# Patient Record
Sex: Male | Born: 1975 | Race: White | Hispanic: No | Marital: Married | Smoking: Never smoker
Health system: Southern US, Community
[De-identification: ages and names within clinical notes are randomized; demographics above are authoritative.]

## PROBLEM LIST (undated history)

## (undated) DIAGNOSIS — Z8619 Personal history of other infectious and parasitic diseases: Secondary | ICD-10-CM

## (undated) HISTORY — DX: Personal history of other infectious and parasitic diseases: Z86.19

---

## 2014-01-15 HISTORY — PX: SHOULDER SURGERY: SHX246

## 2014-07-28 ENCOUNTER — Telehealth: Payer: Self-pay | Admitting: Family Medicine

## 2014-07-28 ENCOUNTER — Ambulatory Visit (INDEPENDENT_AMBULATORY_CARE_PROVIDER_SITE_OTHER): Payer: 59 | Admitting: Family Medicine

## 2014-07-28 ENCOUNTER — Encounter: Payer: Self-pay | Admitting: Family Medicine

## 2014-07-28 VITALS — BP 117/79 | HR 73 | Temp 98.2°F | Resp 16 | Ht 69.0 in | Wt 214.4 lb

## 2014-07-28 DIAGNOSIS — H9191 Unspecified hearing loss, right ear: Secondary | ICD-10-CM

## 2014-07-28 NOTE — Progress Notes (Signed)
Name: Jimmy Roach   MRN: [id number]    DOB: 01/04/1976   Date:07/28/2014       Progress Note  Subjective  Chief Complaint  Chief Complaint  Patient presents with  . Ear Pain    thinks its swimmers ear. (Right)  x 1 week     HPI  R ear pain x 1 week.  Decreased hearing.  Pain gotten some better, but hearing worse.  Past Medical History  Diagnosis Date  . History of chicken pox     History  Substance Use Topics  . Smoking status: Never Smoker   . Smokeless tobacco: Never Used  . Alcohol Use: No    No current outpatient prescriptions on file.  No Known Allergies  Review of Systems  Constitutional: Negative.  Negative for fever and chills.  HENT: Positive for ear pain, hearing loss and tinnitus. Negative for congestion and ear discharge.   Eyes: Negative for blurred vision and double vision.  Respiratory: Negative for cough, shortness of breath and wheezing.   Cardiovascular: Negative for chest pain and leg swelling.  Gastrointestinal: Negative for abdominal pain and blood in stool.  Genitourinary: Negative for urgency and frequency.  Musculoskeletal: Joint pain: s/p L. shoulder surgery   Skin: Negative for rash.  Neurological: Negative for headaches.      Objective  Filed Vitals:   07/28/14 0838  BP: 117/79  Pulse: 73  Temp: 98.2 F (36.8 C)  Resp: 16  Height: 5\' 9"  (1.753 m)  Weight: 214 lb 6.4 oz (97.251 kg)     Physical Exam  Constitutional: He is well-developed, well-nourished, and in no distress.  HENT:  Head: Normocephalic and atraumatic.  Right Ear: External ear and ear canal normal. Tympanic membrane is perforated (?) and retracted. Decreased hearing is noted.  Left Ear: Hearing, tympanic membrane, external ear and ear canal normal.  Ears:  Neck: Normal range of motion. Neck supple. No thyromegaly present.  Cardiovascular: Normal rate, regular rhythm, normal heart sounds and intact distal pulses.  Exam reveals no gallop and no friction rub.    No murmur heard. Pulmonary/Chest: Effort normal and breath sounds normal. No respiratory distress. He has no wheezes. He has no rales.  Lymphadenopathy:    He has no cervical adenopathy.  Vitals reviewed.       Assessment & Plan  1. Decreased hearing, right  - Ambulatory referral to ENT.

## 2014-07-28 NOTE — Telephone Encounter (Signed)
Pt has appointment on 08/02/2014 at 2:45 pm pt aware

## 2014-07-28 NOTE — Patient Instructions (Signed)
Keep water and fluids out of ear at this time.

## 2014-11-04 ENCOUNTER — Ambulatory Visit (INDEPENDENT_AMBULATORY_CARE_PROVIDER_SITE_OTHER): Payer: PRIVATE HEALTH INSURANCE | Admitting: Family Medicine

## 2014-11-04 ENCOUNTER — Encounter: Payer: Self-pay | Admitting: Family Medicine

## 2014-11-04 VITALS — BP 144/93 | HR 96 | Temp 99.3°F | Resp 16 | Ht 69.0 in | Wt 218.0 lb

## 2014-11-04 DIAGNOSIS — J069 Acute upper respiratory infection, unspecified: Secondary | ICD-10-CM

## 2014-11-04 NOTE — Progress Notes (Signed)
Date:  11/04/2014   Name:  Jimmy Roach   DOB:  06/24/1975   MRN:  [id number]  PCP:  Fidel LevyJames Hawkins Jr, MD    Chief Complaint: Sinusitis   History of Present Illness:  This is a 39 y.o. male with 3d hx sinus pressure, sore throat, B otalgia R>L, clear rhinorrhea, low grade fever. Sxs not worsening, nonsmoker, wife and son with similar sxs, has taken no OTC meds.  Review of Systems:  Review of Systems  HENT: Negative for ear discharge and facial swelling.   Respiratory: Negative for cough and shortness of breath.   Hematological: Negative for adenopathy.    There are no active problems to display for this patient.   Prior to Admission medications   Not on File    No Known Allergies  Past Surgical History  Procedure Laterality Date  . Shoulder surgery Left 2016    tendon     Social History  Substance Use Topics  . Smoking status: Never Smoker   . Smokeless tobacco: Never Used  . Alcohol Use: No    No family history on file.  Medication list has been reviewed and updated.  Physical Examination: BP 144/93 mmHg  Pulse 96  Temp(Src) 99.3 F (37.4 C) (Oral)  Resp 16  Ht 5\' 9"  (1.753 m)  Wt 218 lb (98.884 kg)  BMI 32.18 kg/m2  Physical Exam  Constitutional: He appears well-developed and well-nourished.  HENT:  Right Ear: External ear normal.  Left Ear: External ear normal.  Mouth/Throat: No oropharyngeal exudate.  B TM's mildly injected OP mild erythema No sinus tenderness  Pulmonary/Chest: Effort normal and breath sounds normal. He has no wheezes. He has no rales.  Lymphadenopathy:    He has no cervical adenopathy.  Neurological: He is alert.  Skin: Skin is warm and dry.  Psychiatric: He has a normal mood and affect. His behavior is normal.  Nursing note and vitals reviewed.   Assessment and Plan:  1. Viral URI Recommend Sudafed 12h or Afrin NS bid x 3d only, call if sxs worsen or persist   Return if symptoms worsen or fail to  improve.  Dionne AnoWilliam M. Kingsley SpittlePlonk, Jr. MD [facility]  11/04/2014

## 2018-12-24 ENCOUNTER — Other Ambulatory Visit: Payer: Self-pay

## 2018-12-24 ENCOUNTER — Ambulatory Visit
Admission: EM | Admit: 2018-12-24 | Discharge: 2018-12-24 | Disposition: A | Discharge status: Home / Self Care | Payer: No Typology Code available for payment source | Attending: Family Medicine | Admitting: Family Medicine

## 2018-12-24 ENCOUNTER — Encounter: Payer: Self-pay | Admitting: Emergency Medicine

## 2018-12-24 DIAGNOSIS — K5900 Constipation, unspecified: Secondary | ICD-10-CM | POA: Diagnosis not present

## 2018-12-24 MED ORDER — MAGNESIUM CITRATE PO SOLN: 1.0000 | Freq: Once | ORAL | 0 refills | Status: AC

## 2018-12-24 MED ORDER — POLYETHYLENE GLYCOL 3350 17 GM/SCOOP PO POWD: ORAL | 0 refills | Status: DC

## 2018-12-24 NOTE — ED Triage Notes (Addendum)
Patient in today c/o constipation x 2-3 days. Patient's last BM was 4 days ago. Patient took OTC stool softener last night. Patient has not tried any laxatives. Patient states the constipation is so bad that he is having trouble urinating as well.

## 2018-12-24 NOTE — Discharge Instructions (Signed)
Lots of fluids.  Medication as directed.  If no relief, I would try a fleets enema. If persists after that, would recommend manual disimpaction (preferably in the ER)

## 2018-12-24 NOTE — ED Provider Notes (Signed)
[facility]    CSN: [id number] Arrival date & time: 12/24/18  0846  History   Chief Complaint Chief Complaint  Patient presents with  . Constipation   HPI  43 year old male presents with constipation.  Patient reports that he has not had a bowel movement in 3 days.  Patient reports discomfort in his lower abdomen.  Patient states that he has recently tried to have a bowel movement and was unsuccessful.  He states that he noted some stool with wiping.  He states that it was very painful.  Patient has tried a stool softener without resolution.  No other medications or interventions tried.  Denies nausea or vomiting.  He is still able to eat and drink.  No known inciting factor other than stress.  No changes in diet.  No known exacerbating factors.   PMH, Surgical Hx, Family Hx, Social History reviewed and updated as below.  Past Medical History:  Diagnosis Date  . History of chicken pox    Past Surgical History:  Procedure Laterality Date  . SHOULDER SURGERY Left 2016   tendon    Home Medications    Prior to Admission medications   Medication Sig Start Date End Date Taking? Authorizing Provider  magnesium citrate SOLN Take 296 mLs (1 Bottle total) by mouth once for 1 dose. 12/24/18 12/24/18  Tommie Sams, DO  polyethylene glycol powder (GLYCOLAX/MIRALAX) 17 GM/SCOOP powder 17 g once or twice daily as needed for constipation. 12/24/18   Tommie Sams, DO    Family History Family History  Problem Relation Age of Onset  . Hypertension Mother   . Heart attack Father        late 28s early 67s  . Hyperlipidemia Father     Social History Social History   Tobacco Use  . Smoking status: Never Smoker  . Smokeless tobacco: Never Used  Substance Use Topics  . Alcohol use: No  . Drug use: No    Allergies   Patient has no known allergies.  Review of Systems Review of Systems  Constitutional: Negative.   Gastrointestinal: Positive for constipation.   Physical  Exam Triage Vital Signs ED Triage Vitals  Enc Vitals Group     BP 12/24/18 0915 136/82     Pulse Rate 12/24/18 0915 66     Resp 12/24/18 0915 18     Temp 12/24/18 0915 98.4 F (36.9 C)     Temp Source 12/24/18 0915 Oral     SpO2 12/24/18 0915 99 %     Weight 12/24/18 0916 207 lb (93.9 kg)     Height 12/24/18 0916 5\' 9"  (1.753 m)     Head Circumference --      Peak Flow --      Pain Score 12/24/18 0916 0     Pain Loc --      Pain Edu? --      Excl. in GC? --    Updated Vital Signs BP 136/82 (BP Location: Left Arm)   Pulse 66   Temp 98.4 F (36.9 C) (Oral)   Resp 18   Ht 5\' 9"  (1.753 m)   Wt 93.9 kg   SpO2 99%   BMI 30.57 kg/m   Visual Acuity Right Eye Distance:   Left Eye Distance:   Bilateral Distance:    Right Eye Near:   Left Eye Near:    Bilateral Near:     Physical Exam Vitals signs and nursing note reviewed.  Constitutional:  General: He is not in acute distress.    Appearance: Normal appearance. He is not ill-appearing.  HENT:     Head: Normocephalic and atraumatic.  Eyes:     General:        Right eye: No discharge.        Left eye: No discharge.     Conjunctiva/sclera: Conjunctivae normal.  Cardiovascular:     Rate and Rhythm: Normal rate and regular rhythm.     Heart sounds: No murmur.  Pulmonary:     Effort: Pulmonary effort is normal.     Breath sounds: Normal breath sounds. No wheezing, rhonchi or rales.  Abdominal:     General: There is no distension.     Palpations: Abdomen is soft.     Comments: No discrete areas of tenderness.  Slightly decreased bowel sounds.  However, bowel sounds present in all 4 quadrants.  Neurological:     Mental Status: He is alert.  Psychiatric:        Mood and Affect: Mood normal.        Behavior: Behavior normal.    UC Treatments / Results  Labs (all labs ordered are listed, but only abnormal results are displayed) Labs Reviewed - No data to display  EKG   Radiology No results found.   Procedures Procedures (including critical care time)  Medications Ordered in UC Medications - No data to display  Initial Impression / Assessment and Plan / UC Course  I have reviewed the triage vital signs and the nursing notes.  Pertinent labs & imaging results that were available during my care of the patient were reviewed by me and considered in my medical decision making (see chart for details).    43 year old male presents with constipation.  Treating with mag citrate and subsequently MiraLAX.  Advise use of an enema if needed.  If subsequently persists, should go to the ER for disimpaction.  Final Clinical Impressions(s) / UC Diagnoses   Final diagnoses:  Constipation, unspecified constipation type     Discharge Instructions     Lots of fluids.  Medication as directed.  If no relief, I would try a fleets enema. If persists after that, would recommend manual disimpaction (preferably in the ER)   ED Prescriptions    Medication Sig Dispense Auth. Provider   magnesium citrate SOLN Take 296 mLs (1 Bottle total) by mouth once for 1 dose. 296 mL Griffen Frayne G, DO   polyethylene glycol powder (GLYCOLAX/MIRALAX) 17 GM/SCOOP powder 17 g once or twice daily as needed for constipation. 500 g Coral Spikes, DO     PDMP not reviewed this encounter.   Thersa Salt [place], [place] 12/24/18 [phone]

## 2019-02-11 ENCOUNTER — Other Ambulatory Visit: Payer: Self-pay | Admitting: Chiropractic Medicine

## 2019-02-11 ENCOUNTER — Ambulatory Visit
Admission: RE | Admit: 2019-02-11 | Discharge: 2019-02-11 | Disposition: A | Discharge status: Home / Self Care | Payer: No Typology Code available for payment source | Source: Ambulatory Visit | Attending: Chiropractic Medicine | Admitting: Chiropractic Medicine

## 2019-02-11 DIAGNOSIS — M545 Low back pain: Secondary | ICD-10-CM | POA: Diagnosis present

## 2019-02-11 DIAGNOSIS — G8929 Other chronic pain: Secondary | ICD-10-CM

## 2020-12-25 IMAGING — CR DG LUMBAR SPINE COMPLETE 4+V
5 series · 5 of 5 positions shown · non-contrast
Comparison: None.

CLINICAL DATA: Chronic low back pain six months.

EXAM:
LUMBAR SPINE - COMPLETE 4+ VIEW

[l-spine obl (1 of 2)]
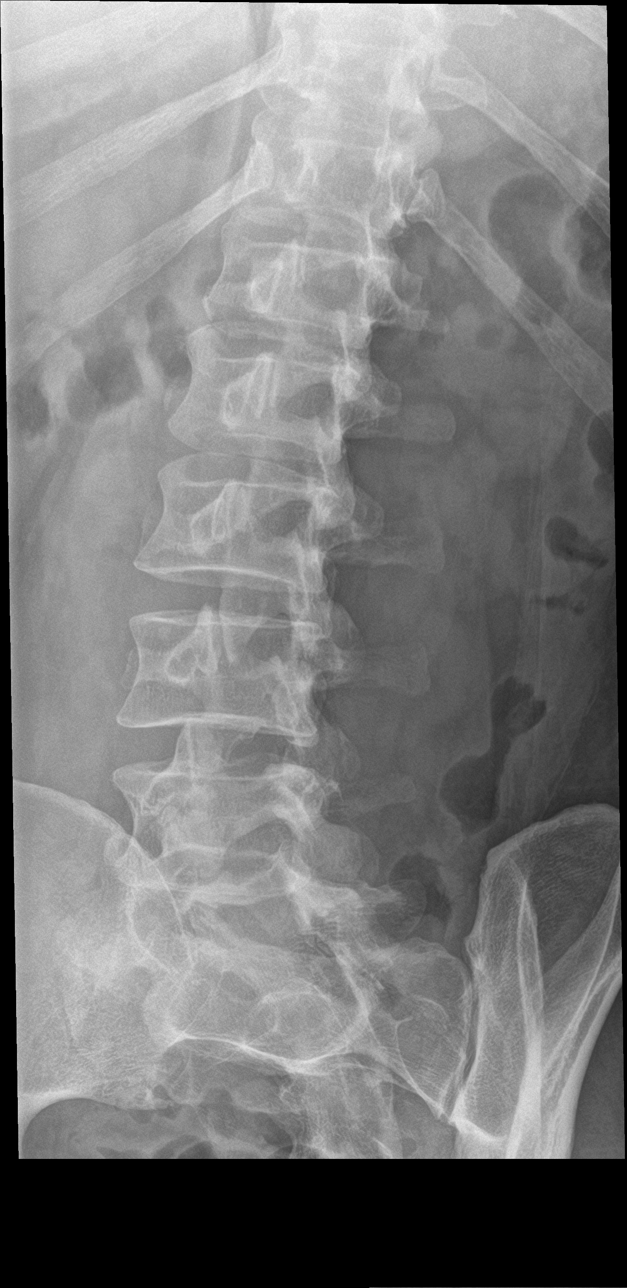

[l-spine obl (2 of 2)]
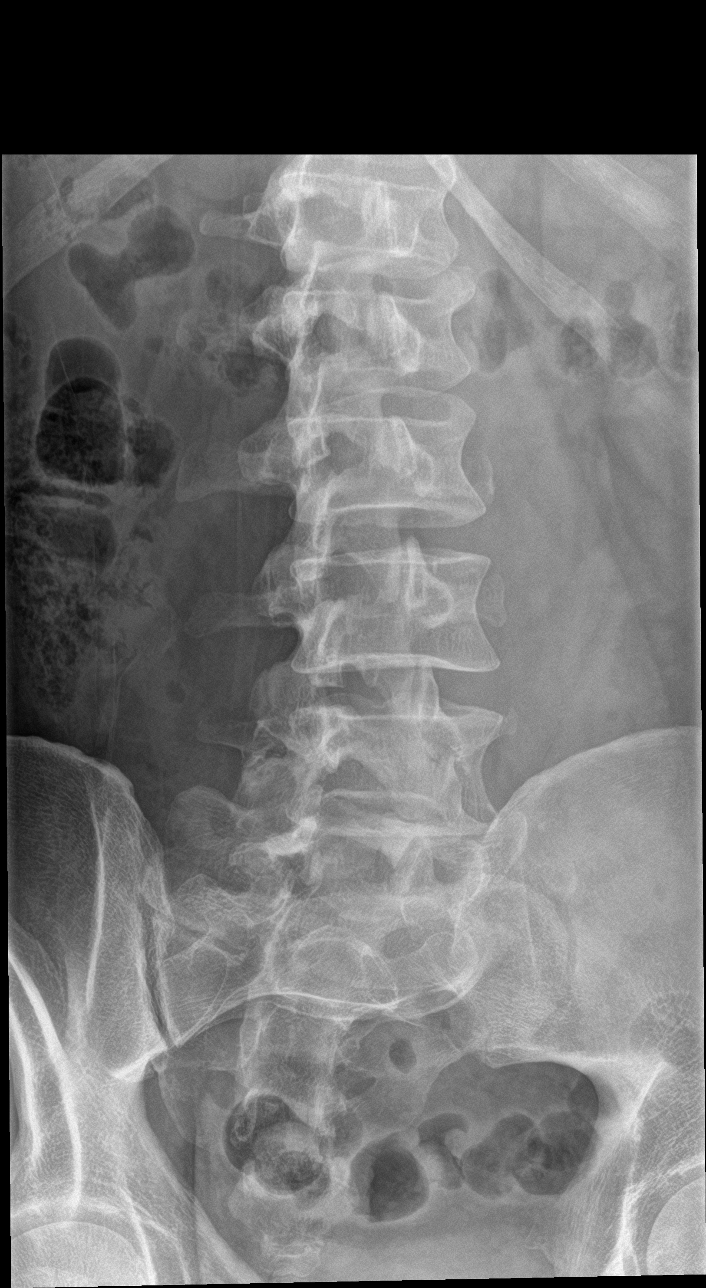

[l-spine lat]
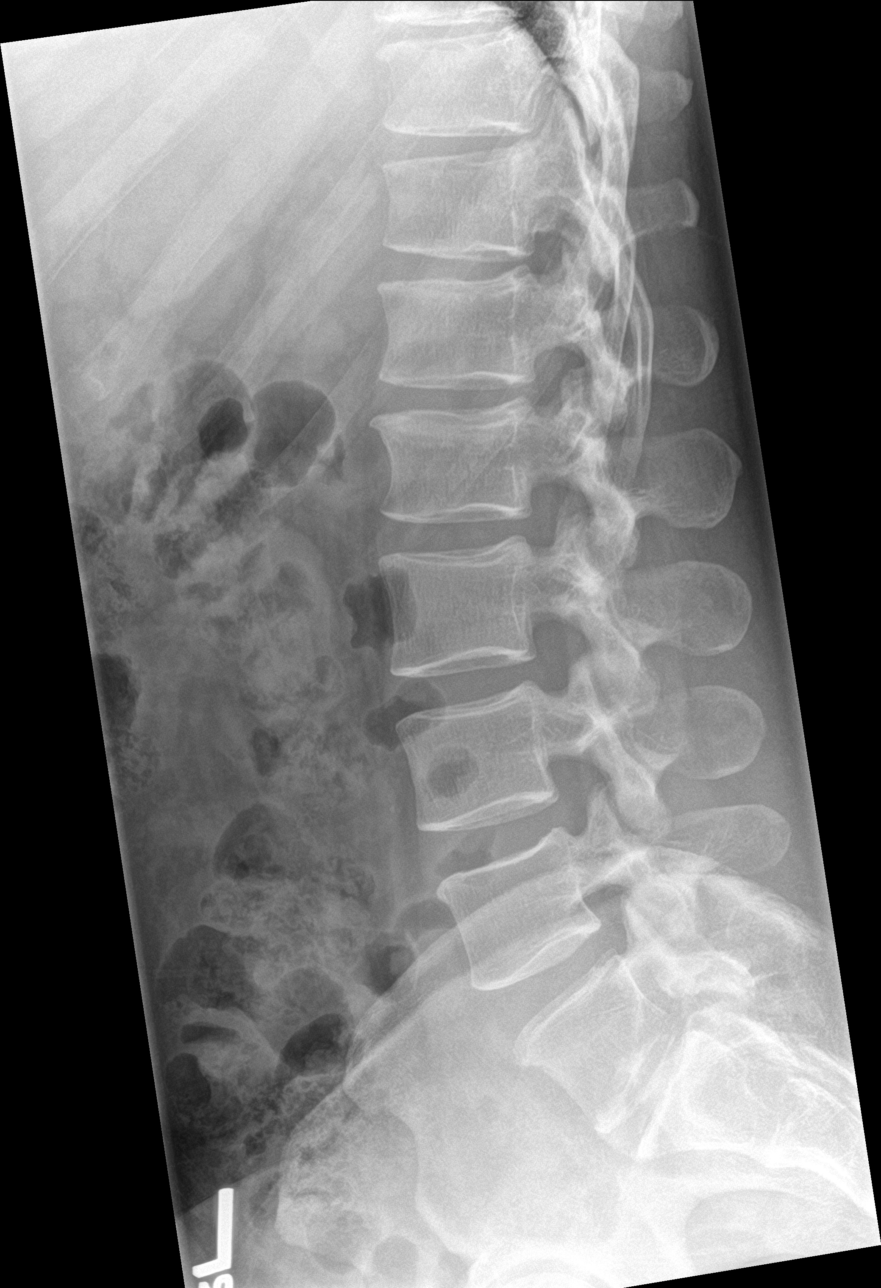

[l-spine spot]
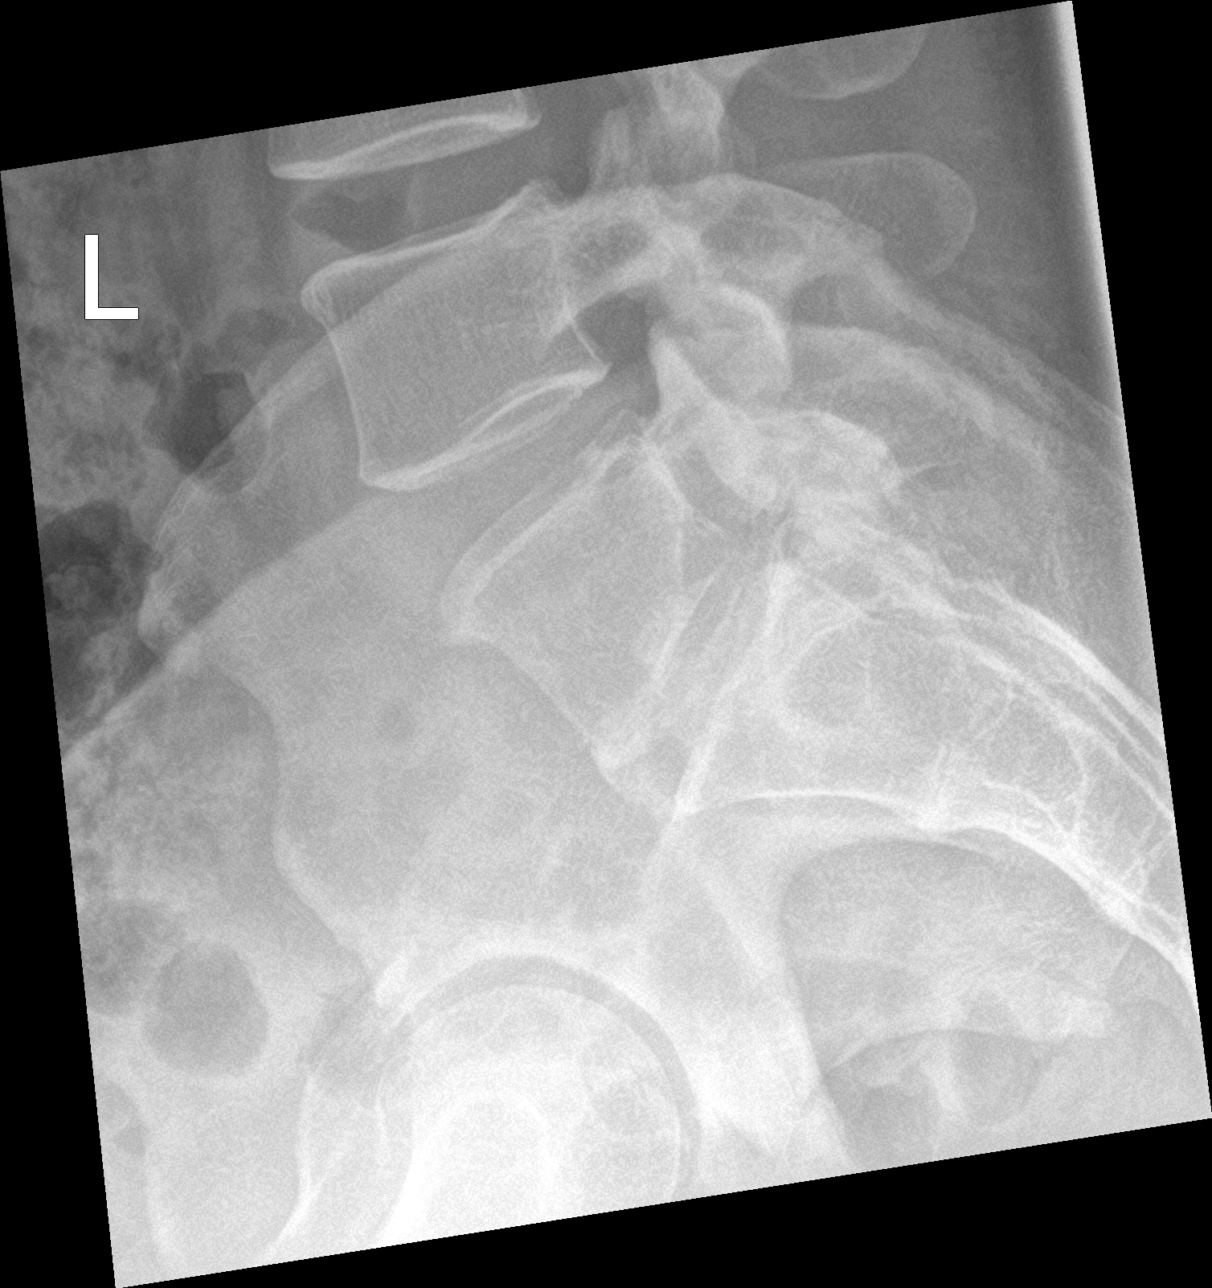

[l-spine ap]
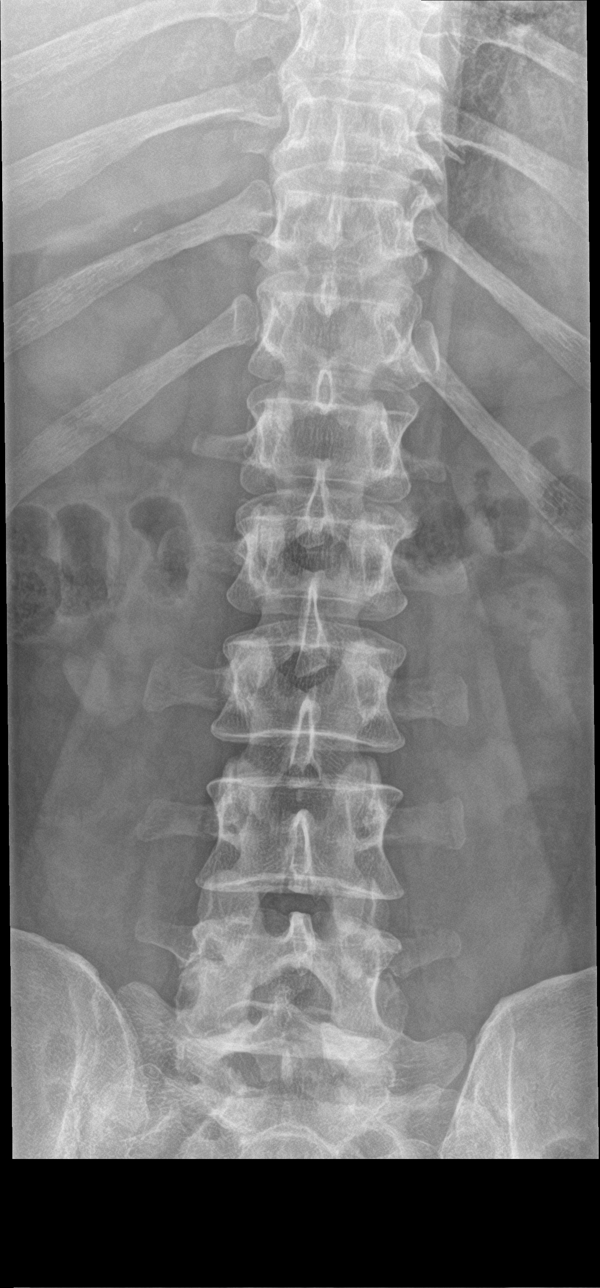

[5 of 5 positions shown; findings below may reference images not displayed]

FINDINGS: Six non rib-bearing lumbar vertebrae with transitional vertebrae at
the lumbosacral junction which will be called L6. Vertebral body
alignment and heights are within normal. There is no acute
compression fracture or evidence of spondylolisthesis. No
significant disc space narrowing. Remainder the exam is
unremarkable.
IMPRESSION: No acute findings.

## 2022-12-24 ENCOUNTER — Ambulatory Visit
Admission: EM | Admit: 2022-12-24 | Discharge: 2022-12-24 | Disposition: A | Discharge status: Home / Self Care | Payer: 59 | Attending: Emergency Medicine | Admitting: Emergency Medicine

## 2022-12-24 DIAGNOSIS — L03012 Cellulitis of left finger: Secondary | ICD-10-CM | POA: Diagnosis not present

## 2022-12-24 MED ORDER — AMOXICILLIN-POT CLAVULANATE 875-125 MG PO TABS: 1.0000 | ORAL_TABLET | Freq: Two times a day (BID) | ORAL | 0 refills | Status: AC

## 2022-12-24 NOTE — ED Provider Notes (Signed)
[facility]    CSN: [id number] Arrival date & time: 12/24/22  1740      History   Chief Complaint Chief Complaint  Patient presents with   Nail Problem    HPI Jimmy Roach is a 47 y.o. male.   HPI  47 year old male with no significant past medical history presents for evaluation of blood underneath his left fourth finger nail.  He reports that he noticed it 3 to 4 days ago.  It was out near the end of the fingernail so he clipped his fingernail to try and get it to drain but it would not.  He has been noticing that when he wears his gloves at work that when he takes off his gloves there is blood in his glove.  There is also some tenderness at the tip of his finger and some mild redness along the edge of the cuticle.  No pus drainage or fever.  No known injury.  Past Medical History:  Diagnosis Date   History of chicken pox     There are no problems to display for this patient.   Past Surgical History:  Procedure Laterality Date   SHOULDER SURGERY Left 2016   tendon        Home Medications    Prior to Admission medications   Medication Sig Start Date End Date Taking? Authorizing Provider  amoxicillin-clavulanate (AUGMENTIN) 875-125 MG tablet Take 1 tablet by mouth every 12 (twelve) hours for 7 days. 12/24/22 12/31/22 Yes Becky Augusta, NP  polyethylene glycol powder (GLYCOLAX/MIRALAX) 17 GM/SCOOP powder 17 g once or twice daily as needed for constipation. 12/24/18   Tommie Sams, DO    Family History Family History  Problem Relation Age of Onset   Hypertension Mother    Heart attack Father        late 52s early 66s   Hyperlipidemia Father     Social History Social History   Tobacco Use   Smoking status: Never    Passive exposure: Never   Smokeless tobacco: Never  Vaping Use   Vaping status: Never Used  Substance Use Topics   Alcohol use: No   Drug use: No     Allergies   Patient has no known allergies.   Review of Systems Review  of Systems  Constitutional:  Negative for fever.  Skin:  Positive for color change.     Physical Exam Triage Vital Signs ED Triage Vitals  Encounter Vitals Group     BP      Systolic BP Percentile      Diastolic BP Percentile      Pulse      Resp      Temp      Temp src      SpO2      Weight      Height      Head Circumference      Peak Flow      Pain Score      Pain Loc      Pain Education      Exclude from Growth Chart    No data found.  Updated Vital Signs BP (!) 148/86 (BP Location: Left Arm)   Pulse 66   Temp 98.1 F (36.7 C) (Oral)   Resp 18   SpO2 96%   Visual Acuity Right Eye Distance:   Left Eye Distance:   Bilateral Distance:    Right Eye Near:   Left Eye Near:    Bilateral  Near:     Physical Exam Vitals and nursing note reviewed.  Constitutional:      Appearance: Normal appearance. He is not ill-appearing.  HENT:     Head: Normocephalic and atraumatic.  Skin:    General: Skin is warm and dry.     Capillary Refill: Capillary refill takes less than 2 seconds.     Findings: Erythema present.  Neurological:     General: No focal deficit present.     Mental Status: He is alert and oriented to person, place, and time.      UC Treatments / Results  Labs (all labs ordered are listed, but only abnormal results are displayed) Labs Reviewed - No data to display  EKG   Radiology No results found.  Procedures Procedures (including critical care time)  Medications Ordered in UC Medications - No data to display  Initial Impression / Assessment and Plan / UC Course  I have reviewed the triage vital signs and the nursing notes.  Pertinent labs & imaging results that were available during my care of the patient were reviewed by me and considered in my medical decision making (see chart for details).   Patient is a pleasant, nontoxic-appearing 47 year old male presenting for evaluation of pain, redness, and blood underneath the fingernail  of the left fourth finger.  As you can see in image above, there is some blood underneath the fingernail at the nail edge on the medial aspect.  The distal tip of the finger as well as the medial cuticle is erythematous.  No fluctuance or induration noted.  The patient's description sounds like initially there was a subungual hematoma but the mechanism of injury is unknown.  The patient denies any kind of impact to his finger that he is aware of.  With the surrounding erythema he is developing a paronychia so we will discharge him home on Augmentin 875 twice daily for 7 days.  He should also soak his finger in warm water and Epsom salts 2-3 times a day till facilitate drainage.  Any new or worsening symptoms he can return for reevaluation.  Final Clinical Impressions(s) / UC Diagnoses   Final diagnoses:  Paronychia of finger of left hand     Discharge Instructions      Take the Augmentin twice daily with food for 7 days.  Soak your finger in warm water and Epsom salts 2-3 times a day to help facilitate drainage.  Keep a dressing on your finger until the drainage has stopped.  You can use over-the-counter Tylenol and ibuprofen according to the package instructions as needed for pain.  Return for reevaluation if you develop any increased redness, swelling, drainage, or red streaks going up your finger, or fever.      ED Prescriptions     Medication Sig Dispense Auth. Provider   amoxicillin-clavulanate (AUGMENTIN) 875-125 MG tablet Take 1 tablet by mouth every 12 (twelve) hours for 7 days. 14 tablet Becky Augusta, NP      PDMP not reviewed this encounter.   Becky Augusta, NP 12/24/22 1859

## 2022-12-24 NOTE — ED Triage Notes (Signed)
Patient states that he had a blood blister on the nail bed of his right ring finger.  He clipped the nail tying to drain the blood under the nail. Finger now feels infected. He wears gloves at work and states that blood has been in his gloves.

## 2022-12-24 NOTE — Discharge Instructions (Addendum)
Take the Augmentin twice daily with food for 7 days.  Soak your finger in warm water and Epsom salts 2-3 times a day to help facilitate drainage.  Keep a dressing on your finger until the drainage has stopped.  You can use over-the-counter Tylenol and ibuprofen according to the package instructions as needed for pain.  Return for reevaluation if you develop any increased redness, swelling, drainage, or red streaks going up your finger, or fever.

## 2023-01-07 ENCOUNTER — Ambulatory Visit
Admission: EM | Admit: 2023-01-07 | Discharge: 2023-01-07 | Disposition: A | Discharge status: Home / Self Care | Payer: 59 | Attending: Family Medicine | Admitting: Family Medicine

## 2023-01-07 ENCOUNTER — Ambulatory Visit (INDEPENDENT_AMBULATORY_CARE_PROVIDER_SITE_OTHER): Payer: 59

## 2023-01-07 DIAGNOSIS — M79644 Pain in right finger(s): Secondary | ICD-10-CM | POA: Diagnosis not present

## 2023-01-07 DIAGNOSIS — D1809 Hemangioma of other sites: Secondary | ICD-10-CM | POA: Diagnosis not present

## 2023-01-07 DIAGNOSIS — L03011 Cellulitis of right finger: Secondary | ICD-10-CM

## 2023-01-07 MED ORDER — DOXYCYCLINE HYCLATE 100 MG PO CAPS
100.0000 mg | ORAL_CAPSULE | Freq: Two times a day (BID) | ORAL | 0 refills | Status: DC
Start: 2023-01-07 — End: 2023-09-18

## 2023-01-07 NOTE — ED Provider Notes (Signed)
[facility]    CSN: [id number] Arrival date & time: 01/07/23  1219      History   Chief Complaint Chief Complaint  Patient presents with   Finger Injury    HPI Jimmy Roach is a 47 y.o. male.   HPI  Jimmy Roach presents for right index finger pain and drainage.  States he was seen here at the urgent care about 1 to 2 weeks ago and was told he had a growth on his fingertip that may need to be drained.  He took the antibiotics which seemed to help but never took it completely away.  Continues to have leaking and blood from this area.  Denies stabbing his finger with anything.  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  He notes that it has a foul odor.  No fevers.  No difficulty moving his finger.  He has otherwise been well and has no additional concerns today.  Past Medical History:  Diagnosis Date   History of chicken pox     There are no active problems to display for this patient.   Past Surgical History:  Procedure Laterality Date   SHOULDER SURGERY Left 2016   tendon        Home Medications    Prior to Admission medications   Medication Sig Start Date End Date Taking? Authorizing Provider  doxycycline (VIBRAMYCIN) 100 MG capsule Take 1 capsule (100 mg total) by mouth 2 (two) times daily. 01/07/23  Yes Wm Fruchter, DO  polyethylene glycol powder (GLYCOLAX/MIRALAX) 17 GM/SCOOP powder 17 g once or twice daily as needed for constipation. 12/24/18   Tommie Sams, DO    Family History Family History  Problem Relation Age of Onset   Hypertension Mother    Heart attack Father        late 53s early 35s   Hyperlipidemia Father     Social History Social History   Tobacco Use   Smoking status: Never    Passive exposure: Never   Smokeless tobacco: Never  Vaping Use   Vaping status: Never Used  Substance Use Topics   Alcohol use: No   Drug use: No      Allergies   Patient has no known allergies.   Review of Systems Review of Systems :negative unless otherwise stated in HPI.      Physical Exam Triage Vital Signs ED Triage Vitals  Encounter Vitals Group     BP 01/07/23 1356 (!) 140/95     Systolic BP Percentile --      Diastolic BP Percentile --      Pulse Rate 01/07/23 1356 66     Resp 01/07/23 1356 17     Temp 01/07/23 1356 98 F (36.7 C)     Temp Source 01/07/23 1356 Oral     SpO2 01/07/23 1356 97 %     Weight --      Height --      Head Circumference --      Peak Flow --      Pain Score 01/07/23 1355 5     Pain Loc --      Pain Education --      Exclude from Growth Chart --    No data found.  Updated Vital Signs BP (!) 140/95 (BP Location: Right Arm)   Pulse 66   Temp 98 F (36.7 C) (Oral)  Resp 17   SpO2 97%   Visual Acuity Right Eye Distance:   Left Eye Distance:   Bilateral Distance:    Right Eye Near:   Left Eye Near:    Bilateral Near:     Physical Exam  GEN: well appearing male in no acute distress  CVS: well perfused  RESP: speaking in full sentences without pause, no respiratory distress  MSK: Right hand: Inspection: No obvious deformity b/l.  No swelling or bruising.  Periungual to distal DIP erythematous and mildly edematous of right index finger.  He has a small what appears to be hemangioma and a paronychia here Palpation: Distal tip of right index finger with tenderness to palpation ROM: Full ROM of the digits and wrist b/l. No swelling in PIP, DIP 1st-3rd, 5th) joints b/l. Flexor digitorum profundus and superficialis tendon functions are intact.  PIP joint collateral ligaments are stable  Strength: 5/5 strength in the forearm, wrist and interosseus muscles b/l Neurovascular: NV intact b/l Skin: See MSK above    UC Treatments / Results  Labs (all labs ordered are listed, but only abnormal results are displayed) Labs Reviewed - No data to display  EKG   Radiology DG  Finger Ring Right Result Date: 01/07/2023 CLINICAL DATA:  Edema and erythema right fourth digit, discharge EXAM: RIGHT RING FINGER 2+V COMPARISON:  None Available. FINDINGS: Frontal, oblique, and lateral views of the right fourth digit are obtained. There are no acute or destructive bony abnormalities. Joint spaces are well preserved. Alignment is anatomic. There is prominent soft tissue swelling throughout the distal aspect of the fourth digit, with no evidence of subcutaneous gas or radiopaque foreign body. IMPRESSION: 1. Distal soft tissue swelling of the fourth digit. No radiopaque foreign body or acute bony abnormality. No radiographic evidence of osteomyelitis. Electronically Signed   By: Sharlet Salina M.D.   On: 01/07/2023 15:31    Procedures Procedures Incision/Drainage of Paronychia Performed [id number] Jahziah Simonin, DO Authorized by: Consent: Verbal consent obtained. Risks and benefits: risks, benefits and alternatives were discussed Consent given by: patient  Patient understanding: patient states understanding of the procedure being performed Patient consent: the patient's understanding of the procedure matches consent given Patient identity confirmed: verbally  Time out: Immediately prior to procedure a "time out" was called to verify the correct patient, procedure, equipment, support staff and site/side marked as required. Type: abscess Location details: Right index finger Anesthesia: Topical PainEase spray as nerve block declined Incision type: single straight  Complexity: simple Drainage: purulent Drainage amount: moderate Patient tolerance: Patient tolerated the procedure well with no immediate complications.    (including critical care time)  Medications Ordered in UC Medications - No data to display  Initial Impression / Assessment and Plan / UC Course  I have reviewed the triage vital signs and the nursing notes.  Pertinent labs & imaging results that were available  during my care of the patient were reviewed by me and considered in my medical decision making (see chart for details).     Patient is a 47 y.o. malewho presents for right index finger pain, discharge, erythema, and edema for at least 2 weeks.  Given that his symptoms did not completely resolve with Augmentin recommended x-ray to evaluate for possible foreign body and bony involvement.  X-ray obtained and personally reviewed by me showing no radiopaque foreign body, fracture or dislocation.  Radiologist impression reviewed.   Overall, patient is well-appearing and well-hydrated.  Vital signs stable.  Sabri is afebrile.  Exam  concerning for hemangioma and paronychia.  He was treated previously with Augmentin without resolution of paronychia.  Denies history of MRSA but does note that he has recurrent boils in his axilla.  Will treat with doxycycline twice daily for 10 day.  Discussed I&D of paronychia and he is agreeable.  Bloody purulent discharge expressed.  Tylenol and/or Motrin as needed for pain.  Recommended patient follow-up with hand surgery if symptoms do not resolve.  Contact information given for Dr. Stephenie Acres at [facility].   Reviewed expectations regarding course of current medical issues.  All questions asked were answered.  Outlined signs and symptoms indicating need for more acute intervention. Patient verbalized understanding. After Visit Summary given.   Final Clinical Impressions(s) / UC Diagnoses   Final diagnoses:  Pain of finger of right hand  Paronychia of right index finger  Hemangioma of other sites     Discharge Instructions      See handout on paronychia. Stop by the pharmacy to pick up your prescriptions.  Follow up with your primary care provider as needed.  Call  Dr Donnald Garre a hand specialist for your hand wound.  She likely can see your at [facility] in [place].        ED Prescriptions     Medication Sig Dispense Auth. Provider   doxycycline  (VIBRAMYCIN) 100 MG capsule Take 1 capsule (100 mg total) by mouth 2 (two) times daily. 20 capsule Katha Cabal, DO      PDMP not reviewed this encounter.              Katha Cabal, DO 01/07/23 2355

## 2023-01-07 NOTE — Discharge Instructions (Addendum)
See handout on paronychia. Stop by the pharmacy to pick up your prescriptions.  Follow up with your primary care provider as needed.  Call  Dr Donnald Garre a hand specialist for your hand wound.  She likely can see your at [facility] in [place].

## 2023-01-07 NOTE — ED Triage Notes (Signed)
Patient states that he still has a spot under his right ring finger. Foul smelling discharge

## 2023-01-08 ENCOUNTER — Ambulatory Visit: Payer: 59

## 2023-09-18 ENCOUNTER — Ambulatory Visit
Admission: RE | Admit: 2023-09-18 | Discharge: 2023-09-18 | Disposition: A | Discharge status: Home / Self Care | Payer: Self-pay | Source: Ambulatory Visit | Attending: Family Medicine | Admitting: Family Medicine

## 2023-09-18 VITALS — BP 150/84 | HR 81 | Temp 98.4°F | Resp 18 | Wt 221.0 lb

## 2023-09-18 DIAGNOSIS — R030 Elevated blood-pressure reading, without diagnosis of hypertension: Secondary | ICD-10-CM | POA: Diagnosis not present

## 2023-09-18 DIAGNOSIS — H6122 Impacted cerumen, left ear: Secondary | ICD-10-CM

## 2023-09-18 NOTE — Discharge Instructions (Addendum)
 Call and schedule an appointment with your primary care provider regarding your blood pressure. Work on the lifestyle changes as discussed.  See handout regarding managing your high blood pressure attached.   Stop by the pharmacy to pick up Debrox or NeilMed clearcanal for earwax removal.

## 2023-09-18 NOTE — ED Provider Notes (Signed)
 [facility]    CSN: [id number] Arrival date & time: 09/18/23  1549      History   Chief Complaint Chief Complaint  Patient presents with   Ear Fullness    Left ear, Swimmers ear - Entered by patient    HPI Jimmy Roach is a 48 y.o. male.   HPI   Jimmy Roach presents for left ear pain with associated {Symptoms; ear pain:5007}.     Fever : no  Chills: no Sore throat: no   Cough: no Sputum: no Nasal congestion : no  Rhinorrhea: no Myalgias: no Appetite: normal  Hydration: normal  Abdominal pain: no Nausea: no Vomiting: no Sleep disturbance: no Headache: no      Past Medical History:  Diagnosis Date   History of chicken pox     There are no active problems to display for this patient.   Past Surgical History:  Procedure Laterality Date   SHOULDER SURGERY Left 2016   tendon        Home Medications    Prior to Admission medications   Medication Sig Start Date End Date Taking? Authorizing Provider  doxycycline  (VIBRAMYCIN ) 100 MG capsule Take 1 capsule (100 mg total) by mouth 2 (two) times daily. 01/07/23   Naia Ruff, DO  polyethylene glycol powder (GLYCOLAX /MIRALAX ) 17 GM/SCOOP powder 17 g once or twice daily as needed for constipation. 12/24/18   Cook, Jayce G, DO    Family History Family History  Problem Relation Age of Onset   Hypertension Mother    Heart attack Father        late 54s early 16s   Hyperlipidemia Father     Social History Social History   Tobacco Use   Smoking status: Never    Passive exposure: Never   Smokeless tobacco: Never  Vaping Use   Vaping status: Never Used  Substance Use Topics   Alcohol use: No   Drug use: No     Allergies   Dog epithelium (canis lupus familiaris)   Review of Systems Review of Systems: :negative unless otherwise stated in HPI.      Physical Exam Triage Vital Signs ED Triage Vitals  Encounter Vitals Group     BP 09/18/23 1600 (!) 150/84     Girls Systolic BP  Percentile --      Girls Diastolic BP Percentile --      Boys Systolic BP Percentile --      Boys Diastolic BP Percentile --      Pulse Rate 09/18/23 1600 81     Resp 09/18/23 1600 18     Temp 09/18/23 1600 98.4 F (36.9 C)     Temp Source 09/18/23 1600 Oral     SpO2 09/18/23 1600 97 %     Weight 09/18/23 1558 221 lb (100.2 kg)     Height --      Head Circumference --      Peak Flow --      Pain Score 09/18/23 1559 0     Pain Loc --      Pain Education --      Exclude from Growth Chart --    No data found.  Updated Vital Signs BP (!) 150/84 (BP Location: Left Arm)   Pulse 81   Temp 98.4 F (36.9 C) (Oral)   Resp 18   Wt 100.2 kg   SpO2 97%   BMI 32.64 kg/m   Visual Acuity Right Eye Distance:   Left Eye Distance:  Bilateral Distance:    Right Eye Near:   Left Eye Near:    Bilateral Near:     Physical Exam GEN:     alert, non-toxic appearing male in no distress ***   HENT:  mucus membranes moist, ***no nasal discharge, right TM ***, left TM ***, normal external auditory canals bilaterally, nontender tragus EYES:   ***no scleral injection or discharge  NECK:  normal ROM, no ***lymphadenopathy, ***no meningismus   RESP:  no increased work of breathing CVS:   regular rate  Skin:   warm and dry    UC Treatments / Results  Labs (all labs ordered are listed, but only abnormal results are displayed) Labs Reviewed - No data to display  EKG   Radiology No results found.  Procedures Procedures (including critical care time)  Medications Ordered in UC Medications - No data to display  Initial Impression / Assessment and Plan / UC Course  I have reviewed the triage vital signs and the nursing notes.  Pertinent labs & imaging results that were available during my care of the patient were reviewed by me and considered in my medical decision making (see chart for details).     ***   ***Acute Otitis media Jimmy Roach is a 48 y.o. male who presents for  *** ear pain. Overall patient is well-appearing, well-hydrated and without respiratory distress. Jimmy Roach is afebrile. Treat with Augmentin  BID for 7 days.  Tylenol/Motrin's as needed for fever or discomfort.     ***Cerumen impaction  Pt is a 48 y.o. male with *** days of hearing  loss ***ear pain. Ceruminosis is noted ***bilaterally.  Wax is removed by syringing debridement.  On reassessment, TM clear and without erythema or bulging. No purulent discharge in canal. Doubt acute otitis media or externa. Hearing has improved. Instructions for home care to prevent wax buildup are given.  Discussed MDM, treatment plan and plan for follow-up with patient who agrees with plan.      Discussed MDM, treatment plan and plan for follow-up with patient/parent ***who agrees with plan.   Final Clinical Impressions(s) / UC Diagnoses   Final diagnoses:  None   Discharge Instructions   None    ED Prescriptions   None    PDMP not reviewed this encounter.

## 2023-09-18 NOTE — ED Triage Notes (Signed)
 Pt presents with left ear fullness and popping sound x 1 week.

## 2023-09-22 ENCOUNTER — Other Ambulatory Visit: Payer: Self-pay

## 2023-09-22 ENCOUNTER — Encounter: Payer: Self-pay | Admitting: *Deleted

## 2023-09-22 ENCOUNTER — Emergency Department
Admission: EM | Admit: 2023-09-22 | Discharge: 2023-09-23 | Disposition: A | Discharge status: Home / Self Care | Attending: Emergency Medicine | Admitting: Emergency Medicine

## 2023-09-22 ENCOUNTER — Emergency Department

## 2023-09-22 DIAGNOSIS — G8929 Other chronic pain: Secondary | ICD-10-CM | POA: Insufficient documentation

## 2023-09-22 DIAGNOSIS — R519 Headache, unspecified: Secondary | ICD-10-CM | POA: Insufficient documentation

## 2023-09-22 DIAGNOSIS — R0789 Other chest pain: Secondary | ICD-10-CM | POA: Insufficient documentation

## 2023-09-22 LAB — CBC
HCT: 40.6 % (ref 39.0–52.0)
Hemoglobin: 14.6 g/dL (ref 13.0–17.0)
MCH: 31.8 pg (ref 26.0–34.0)
MCHC: 36 g/dL (ref 30.0–36.0)
MCV: 88.5 fL (ref 80.0–100.0)
Platelets: 195 K/uL (ref 150–400)
RBC: 4.59 MIL/uL (ref 4.22–5.81)
RDW: 11.6 % (ref 11.5–15.5)
WBC: 7.6 K/uL (ref 4.0–10.5)
nRBC: 0 % (ref 0.0–0.2)

## 2023-09-22 LAB — BASIC METABOLIC PANEL WITH GFR
Anion gap: 9 (ref 5–15)
BUN: 20 mg/dL (ref 6–20)
CO2: 24 mmol/L (ref 22–32)
Calcium: 9 mg/dL (ref 8.9–10.3)
Chloride: 103 mmol/L (ref 98–111)
Creatinine, Ser: 1.37 mg/dL — ABNORMAL HIGH (ref 0.61–1.24)
GFR, Estimated: >60 mL/min (ref >60)
Glucose, Bld: 137 mg/dL — ABNORMAL HIGH (ref 70–99)
Potassium: 3.5 mmol/L (ref 3.5–5.1)
Sodium: 136 mmol/L (ref 135–145)

## 2023-09-22 LAB — TROPONIN I (HIGH SENSITIVITY): Troponin I (High Sensitivity): 4 ng/L (ref <18)

## 2023-09-22 MED ORDER — PROCHLORPERAZINE EDISYLATE 10 MG/2ML IJ SOLN
10.0000 mg | INTRAMUSCULAR | Status: AC
  Administered 2023-09-23: 10 mg via INTRAVENOUS
  Filled 2023-09-22: qty 2

## 2023-09-22 MED ORDER — DEXAMETHASONE SODIUM PHOSPHATE 10 MG/ML IJ SOLN
10.0000 mg | Freq: Once | INTRAMUSCULAR | Status: AC
  Administered 2023-09-23: 10 mg via INTRAVENOUS
  Filled 2023-09-22: qty 1

## 2023-09-22 MED ORDER — KETOROLAC TROMETHAMINE 30 MG/ML IJ SOLN
15.0000 mg | Freq: Once | INTRAMUSCULAR | Status: AC
  Administered 2023-09-23: 15 mg via INTRAVENOUS
  Filled 2023-09-22: qty 1

## 2023-09-22 MED ORDER — DIPHENHYDRAMINE HCL 50 MG/ML IJ SOLN
12.5000 mg | INTRAMUSCULAR | Status: AC
  Administered 2023-09-23: 12.5 mg via INTRAVENOUS
  Filled 2023-09-22: qty 1

## 2023-09-22 MED ORDER — SODIUM CHLORIDE 0.9 % IV BOLUS
500.0000 mL | Freq: Once | INTRAVENOUS | Status: AC
  Administered 2023-09-23: 500 mL via INTRAVENOUS

## 2023-09-22 NOTE — ED Provider Notes (Signed)
 [facility] Provider Note    Event Date/Time   First MD Initiated Contact with Patient 09/22/23 2255     (approximate)   History   Chest Pain and Headache   HPI Jimmy Roach is a 48 y.o. male who presents for evaluation of multiple complaints.    1) headache: The patient states that for about 2 months he has had a constant throbbing headache that is most notable behind his eyes.  Sometimes it gets much worse.  Last week he was at work and it became so severe that he could not function and he said that the vision in his left eye went all white.  He believes that it was a severe migraine though he has never specifically been diagnosed with migraines.  He was able to get home and slept for a long time and then felt a little bit better but the throbbing headache continues.  He has intermittently sensitive to light and loud noises but he has not ever had any nausea or vomiting.  He denies any numbness or weakness in his extremities.  2) left-sided chest and shoulder tightness: Patient reports that tonight around 10 PM he suddenly felt very tight in the left upper part of his chest.  He felt like he needed to stretch it out but it was painful to do so when he moved his left arm and shoulder around in certain directions.  The tightness in his chest worried him because of the proximity to his heart.  He was not short of breath.  He has a history of a severe left-sided clavicle fracture that required surgery about 10 years ago but he has not had issues with that arm or shoulder in the past.  After the tightness began, he felt some numbness and tingling in his left pinky finger and felt like his left arm was cold.  Those symptoms have improved but he still feels the tightness in the chest     Physical Exam   Triage Vital Signs: ED Triage Vitals  Encounter Vitals Group     BP 09/22/23 2243 (!) 163/101     Girls Systolic BP Percentile --      Girls Diastolic BP Percentile  --      Boys Systolic BP Percentile --      Boys Diastolic BP Percentile --      Pulse Rate 09/22/23 2243 87     Resp 09/22/23 2243 18     Temp 09/22/23 2243 99.1 F (37.3 C)     Temp src --      SpO2 09/22/23 2243 100 %     Weight --      Height --      Head Circumference --      Peak Flow --      Pain Score 09/22/23 2244 2     Pain Loc --      Pain Education --      Exclude from Growth Chart --     Most recent vital signs: Vitals:   09/22/23 2243 09/23/23 0000  BP: (!) 163/101 119/70  Pulse: 87 68  Resp: 18 18  Temp: 99.1 F (37.3 C)   SpO2: 100% 97%    General: Awake, no distress.  Generally well-appearing.  Pupils are equal and reactive bilaterally.  Extraocular motion is intact with no nystagmus. CV:  Good peripheral perfusion.  Easily palpable left radial pulse.  Normal heart sounds.  Left arm and hand have normal  capillary refill and are warm and well-perfused. Resp:  Normal effort. Speaking easily and comfortably, no accessory muscle usage nor intercostal retractions.  Lungs are clear to auscultation bilaterally. Abd:  No distention.  Other:  Patient has a well-healed surgical scar over his left clavicle and shoulder consistent with his history of a clavicular injury.  He has normal range of motion of his left shoulder and is able to reach across to the contralateral shoulder without difficulty.  He cannot reproduce the symptoms by moving his arm and shoulder around and currently has no numbness or tingling in his arm or hand.  He   ED Results / Procedures / Treatments   Labs (all labs ordered are listed, but only abnormal results are displayed) Labs Reviewed  BASIC METABOLIC PANEL WITH GFR - Abnormal; Notable for the following components:      Result Value   Glucose, Bld 137 (*)    Creatinine, Ser 1.37 (*)    All other components within normal limits  CBC  TROPONIN I (HIGH SENSITIVITY)  TROPONIN I (HIGH SENSITIVITY)     EKG  ED ECG REPORT I, Darleene Dome, the attending physician, personally viewed and interpreted this ECG.  Date: 09/22/2023 EKG Time: 22: 41 Rate: 76 Rhythm: normal sinus rhythm QRS Axis: normal Intervals: normal ST/T Wave abnormalities: normal Narrative Interpretation: no evidence of acute ischemia    RADIOLOGY I independently viewed and interpreted the patient's chest x-ray I see no evidence of pneumonia or pneumothorax.  I also read the radiologist's report, which confirmed no acute findings.   PROCEDURES:  Critical Care performed: No  Procedures    IMPRESSION / MDM / ASSESSMENT AND PLAN / ED COURSE  I reviewed the triage vital signs and the nursing notes.                              Differential diagnosis includes, but is not limited to, musculoskeletal strain, nerve impingement in the left shoulder, ACS, pneumonia, pneumothorax, PE, intracranial hemorrhage, intracranial neoplasm, aneurysm, anxiety.  Patient's presentation is most consistent with acute presentation with potential threat to life or bodily function.  Labs/studies ordered: EKG, chest x-ray, CTA head/neck with and without contrast, BMP, CBC, high-sensitivity troponin  Interventions/Medications given:  Medications  ketorolac  (TORADOL ) 30 MG/ML injection 15 mg (15 mg Intravenous Given 09/23/23 0028)  prochlorperazine  (COMPAZINE ) injection 10 mg (10 mg Intravenous Given 09/23/23 0024)  diphenhydrAMINE  (BENADRYL ) injection 12.5 mg (12.5 mg Intravenous Given 09/23/23 0029)  dexamethasone  (DECADRON ) injection 10 mg (10 mg Intravenous Given 09/23/23 0029)  sodium chloride  0.9 % bolus 500 mL (0 mLs Intravenous Stopped 09/23/23 0130)  iohexol  (OMNIPAQUE ) 350 MG/ML injection 75 mL (75 mLs Intravenous Contrast Given 09/23/23 0048)    (Note:  hospital course my include additional interventions and/or labs/studies not listed above.)   Patient's blood pressure is a bit elevated but otherwise his vital signs are reassuring.  His wife reports that they  were planning to get him into see a primary care provider within the next week or so to address his blood pressure issue and he does not currently have a PCP.  He is low risk for ACS and given the relative acute and recent onset of his chest tightness, I will check a second troponin, but his first 1 is normal and he has no ischemia on EKG.  This is very unlikely to represent ACS and it seems very musculoskeletal.  The numbness in his  finger seems most consistent with a transient nerve impingement, particularly in the ulnar distribution.  This may be related to his prior left shoulder/clavicular surgery.  Of more concern is the 2 months of headache with intermittent symptoms that are much worse.  We talked about it and agreed to treat him empirically for a migraine but I will check a CTA head/neck to make sure there is no evidence of intracranial hemorrhage, neoplasm, or aneurysm.  He agrees with the plan and if the rest of his workup is reassuring I anticipate discharge with outpatient follow-up.  Patient and wife agree with the plan.     Clinical Course as of 09/23/23 9787  [facility] Sep 23, 2023  0108 CT ANGIO HEAD NECK W WO CM I independently viewed and interpreted the patient's CTA head and neck and I see no evidence of neoplasm, intracranial hemorrhage, or large vessel occlusion.  Radiologist confirmed no acute findings identified on images. [CF]  0210 Reassessed patient after his second troponin came back within normal limits.  He said that he is feeling better but can still feel my heartbeat in my head.  However he said that the pain is better than it has been.  I went over the results of all of his imaging and other studies.  His wife has plans for him to follow-up at [facility] primary care and I think that is very reasonable.  Although his creatinine is up a little bit this does not seem to be acute and he is able to eat and drink normally.  The patient's medical screening exam is  reassuring with no indication of an emergent medical condition requiring hospitalization or additional evaluation at this point.  The patient is safe and appropriate for discharge and outpatient follow up.  I gave my usual and customary return precautions.  [CF]    Clinical Course User Index [CF] Gordan Huxley, MD     FINAL CLINICAL IMPRESSION(S) / ED DIAGNOSES   Final diagnoses:  Atypical chest pain  Chronic intractable headache, unspecified headache type     Rx / DC Orders   ED Discharge Orders     None        Note:  This document was prepared using Dragon voice recognition software and may include unintentional dictation errors.   Gordan Huxley, MD 09/22/23 7663    Gordan Huxley, MD 09/23/23 [phone]

## 2023-09-22 NOTE — ED Triage Notes (Signed)
 Constant throbbing pressure in his head for 2 months. Last week had the worst migraine of my life, has had a persistent headache since then. Reports sitting in his chair tonight, left sided tightness around the clavicle and collar bone. Left arm feels cold and tingling in his fingers. No meds for symptoms tonight.

## 2023-09-23 ENCOUNTER — Emergency Department

## 2023-09-23 LAB — TROPONIN I (HIGH SENSITIVITY): Troponin I (High Sensitivity): 5 ng/L (ref <18)

## 2023-09-23 MED ORDER — IOHEXOL 350 MG/ML SOLN
75.0000 mL | Freq: Once | INTRAVENOUS | Status: AC | PRN
  Administered 2023-09-23: 75 mL via INTRAVENOUS

## 2023-09-23 NOTE — Discharge Instructions (Signed)
 Your workup in the Emergency Department today was reassuring.  We did not find any specific abnormalities.  We recommend you drink plenty of fluids, take your regular medications and/or any new ones prescribed today, and follow up with the doctor(s) listed in these documents as recommended.  Return to the Emergency Department if you develop new or worsening symptoms that concern you.
